# Patient Record
Sex: Female | Born: 1970 | Race: White | Hispanic: No | Marital: Married | Smoking: Never smoker
Health system: Southern US, Community
[De-identification: ages and names within clinical notes are randomized; demographics above are authoritative.]

---

## 1999-02-07 ENCOUNTER — Other Ambulatory Visit: Admission: RE | Admit: 1999-02-07 | Discharge: 1999-02-07 | Payer: Self-pay | Admitting: Obstetrics and Gynecology

## 1999-03-04 ENCOUNTER — Ambulatory Visit (HOSPITAL_COMMUNITY): Admission: RE | Admit: 1999-03-04 | Discharge: 1999-03-04 | Payer: Self-pay | Admitting: Obstetrics and Gynecology

## 1999-03-04 ENCOUNTER — Encounter: Payer: Self-pay | Admitting: Obstetrics and Gynecology

## 2003-02-14 ENCOUNTER — Ambulatory Visit (HOSPITAL_COMMUNITY): Admission: RE | Admit: 2003-02-14 | Discharge: 2003-02-14 | Payer: Self-pay | Admitting: Obstetrics and Gynecology

## 2003-02-14 ENCOUNTER — Encounter: Payer: Self-pay | Admitting: Obstetrics and Gynecology

## 2003-03-27 ENCOUNTER — Inpatient Hospital Stay (HOSPITAL_COMMUNITY): Admission: AD | Admit: 2003-03-27 | Discharge: 2003-03-27 | Payer: Self-pay | Admitting: Obstetrics and Gynecology

## 2003-04-15 ENCOUNTER — Inpatient Hospital Stay (HOSPITAL_COMMUNITY): Admission: AD | Admit: 2003-04-15 | Discharge: 2003-04-15 | Payer: Self-pay | Admitting: Obstetrics and Gynecology

## 2003-04-16 ENCOUNTER — Inpatient Hospital Stay (HOSPITAL_COMMUNITY): Admission: AD | Admit: 2003-04-16 | Discharge: 2003-04-18 | Payer: Self-pay | Admitting: Obstetrics and Gynecology

## 2003-04-19 ENCOUNTER — Encounter: Admission: RE | Admit: 2003-04-19 | Discharge: 2003-05-19 | Payer: Self-pay | Admitting: Obstetrics and Gynecology

## 2003-06-19 ENCOUNTER — Encounter: Admission: RE | Admit: 2003-06-19 | Discharge: 2003-07-19 | Payer: Self-pay | Admitting: Obstetrics and Gynecology

## 2003-07-20 ENCOUNTER — Encounter: Admission: RE | Admit: 2003-07-20 | Discharge: 2003-08-19 | Payer: Self-pay | Admitting: Obstetrics and Gynecology

## 2003-09-19 ENCOUNTER — Encounter: Admission: RE | Admit: 2003-09-19 | Discharge: 2003-10-19 | Payer: Self-pay | Admitting: Obstetrics and Gynecology

## 2003-11-19 ENCOUNTER — Encounter: Admission: RE | Admit: 2003-11-19 | Discharge: 2003-12-19 | Payer: Self-pay | Admitting: Obstetrics and Gynecology

## 2009-04-17 ENCOUNTER — Ambulatory Visit: Payer: Self-pay | Admitting: Diagnostic Radiology

## 2009-04-17 ENCOUNTER — Ambulatory Visit (HOSPITAL_BASED_OUTPATIENT_CLINIC_OR_DEPARTMENT_OTHER): Admission: RE | Admit: 2009-04-17 | Discharge: 2009-04-17 | Payer: Self-pay | Admitting: Cardiology

## 2011-03-20 NOTE — H&P (Signed)
[name], [name]                         ACCOUNT NO.:  [id number]   MEDICAL RECORD NO.:  [id number]                   PATIENT TYPE:  INP   LOCATION:  9174                                 FACILITY:  [facility]   PHYSICIAN:  Concha Pyo. Duplantis, C.N.M.        DATE OF BIRTH:  June 24, 1971   DATE OF ADMISSION:  04/16/2003  DATE OF DISCHARGE:                                HISTORY & PHYSICAL   HISTORY OF PRESENT ILLNESS:  The patient is a 40 year old married white  female, gravida 2, para 1-0-0-1, at 39-3/7 weeks who presents complaining of  uterine contractions that are very painful every five to seven minutes since  about 10:30 this morning. She denies any leaking or vaginal bleeding. She  denies any nausea, vomiting, headaches, or visual disturbances. Her  pregnancy has been followed at [facility] by the certified nurse  midwife service and has been essentially uncomplicated, though, at risk for  history of positive Group B Strep, history of irritable bowel syndrome,  history of depression without problems this pregnancy. Her Group B Strep is  positive with this pregnancy. She is currently undecided regarding pain  management plans.   PAST OBSTETRICAL HISTORY:  She is a gravida 2, para 1-0-0-1, who delivered a  viable female infant in October of 1998 who weighed 8 pounds 4 ounces at 39-  5/7 weeks following a 8-1/2 hour labor.  She delivered vaginally with an  epidural for anesthesia and was induced with that labor secondary to rupture  of membranes.  The infant's name is Vicente Serene.   GYN HISTORY:  Noncontributory.   ALLERGIES:  No known drug allergies.   PAST MEDICAL HISTORY:  She reports having had the usual childhood diseases.  She reports a history of transfusions when she was a tiny infant for a  jaundice and a history of irritable bowel syndrome that has not required  medications.  Occasional urinary tract infections and kidney infection in  1992 which she took p.o.  medications for, and a history of depression.  Her  only surgical history was wisdom teeth in 1988.   FAMILY HISTORY:  Significant for paternal grandfather with MI, father with  chronic hypertension, paternal grandmother with varicosities, maternal  grandfather with insulin-dependent diabetes mellitus, mother with  hypothyroidism, maternal grandmother with breast cancer, and maternal  grandfather with lung cancer, both of them are deceased.  The genetic  history is essentially negative, but the father of the baby's brother and  sister were born with a heart defect that required surgery.   SOCIAL HISTORY:  She is married to [facility]. They are of the Catholic  faith. The deny any illicit drug use, alcohol, or smoking with this  pregnancy. She is employed part time in a [employer]. He is employed full  time in Public relations account executive and they have been followed by the certified nurse  midwife service in this pregnancy.   PRENATAL LABORATORY DATA:  Blood type  is A positive, antibody screen is  negative, syphilis is nonreactive, rubella is positive, hepatitis B surface  antigen is negative. Pap was within normal limits in May of 2003.  One-hour  Glucola was 37 and a 36-week Beta Strep was positive.  Gonorrhea and  Chlamydia were negative.   PHYSICAL EXAMINATION:  VITAL SIGNS: Stable. She is afebrile.  HEENT:  Grossly within normal limits.  HEART:  Regular rate and rhythm.  CHEST:  Clear.  BREASTS:  Soft and nontender.  ABDOMEN:  Gravid with uterine contractions every three to five minutes.  Fetal heart rate is reactive and reassuring.  PELVIC: 5 cm, 100%, vertex -1 with intact membranes.  EXTREMITIES:  Within normal limits.   ASSESSMENT:  1. Intrauterine pregnancy at term.  2. Active labor.  3. Positive Group B Strep.   PLAN:  To admit to labor and delivery.  To follow routine C.N.M. orders and  to notify Dr. Estanislado Pandy of the patient's admission.                                                Concha Pyo. Duplantis, C.N.M.    SJD/MEDQ  D:  04/16/2003  T:  04/16/2003  Job:  [id number]

## 2014-05-10 ENCOUNTER — Encounter (HOSPITAL_BASED_OUTPATIENT_CLINIC_OR_DEPARTMENT_OTHER): Payer: Self-pay | Admitting: Emergency Medicine

## 2014-05-10 ENCOUNTER — Emergency Department (HOSPITAL_BASED_OUTPATIENT_CLINIC_OR_DEPARTMENT_OTHER)
Admission: EM | Admit: 2014-05-10 | Discharge: 2014-05-10 | Disposition: A | Discharge status: Home / Self Care | Payer: Managed Care, Other (non HMO) | Attending: Emergency Medicine | Admitting: Emergency Medicine

## 2014-05-10 ENCOUNTER — Emergency Department (HOSPITAL_BASED_OUTPATIENT_CLINIC_OR_DEPARTMENT_OTHER): Payer: Managed Care, Other (non HMO)

## 2014-05-10 DIAGNOSIS — R072 Precordial pain: Secondary | ICD-10-CM | POA: Insufficient documentation

## 2014-05-10 DIAGNOSIS — R0789 Other chest pain: Secondary | ICD-10-CM

## 2014-05-10 DIAGNOSIS — Z79899 Other long term (current) drug therapy: Secondary | ICD-10-CM | POA: Insufficient documentation

## 2014-05-10 LAB — TROPONIN I: Troponin I: <0.3 ng/mL (ref <0.30)

## 2014-05-10 NOTE — ED Notes (Signed)
Pt developed pain with movement in her right flank, denies injury, states that her muscles hurt

## 2014-05-10 NOTE — ED Notes (Signed)
C/o rt flank pain onset Sunday  Getting worse,  Increased w movement  Denies inj  Denies urinary sx

## 2014-05-10 NOTE — Discharge Instructions (Signed)
You have been diagnosed by your caregiver as having chest wall pain. SEEK IMMEDIATE MEDICAL ATTENTION IF: You develop a fever.  Your chest pains become severe or intolerable.  You develop new, unexplained symptoms (problems).  You develop shortness of breath, nausea, vomiting, sweating or feel light headed.  You develop a new cough or you cough up blood.  Your caregiver has diagnosed you as having chest pain that does not require admission.  You are at low risk for an acute heart condition or other serious illness. Chest pain comes from many different causes.  SEEK IMMEDIATE MEDICAL ATTENTION IF: You have severe chest pain, especially if the pain is crushing or pressure-like and spreads to the arms, back, neck, or jaw, or if you have sweating, nausea (feeling sick to your stomach), or shortness of breath. THIS IS AN EMERGENCY. Don't wait to see if the pain will go away. Get medical help at once. Call 911 or 0 (operator). DO NOT drive yourself to the hospital.  Your chest pain gets worse and does not go away with rest.  You have an attack of chest pain lasting longer than usual, despite rest and treatment with the medications your caregiver has prescribed.  You wake from sleep with chest pain or shortness of breath.  You feel dizzy or faint.  You have chest pain not typical of your usual pain for which you originally saw your caregiver.

## 2014-05-10 NOTE — ED Provider Notes (Signed)
CSN: [id number]     Arrival date & time 05/10/14  1942 History  This chart was scribed for Hurman Horn, MD by Chestine Spore, ED Scribe. The patient was seen in room MH09/MH09 at 9:42 PM.    Chief Complaint  Patient presents with  . Flank Pain     The history is provided by the patient. No language interpreter was used.   HPI Comments: [name] is a 43 y.o. female who presents to the Emergency Department complaining of gradual constant positional nonpleuritic nonexertional right flank pain for over 12 hours. She states that the pain is excerabated with movement. She states that she is normally healthy. She states that these symptoms is new. She states that her pain is the worse with palpation. She denies radiation to the extremities. She denies rash or blisters to the area. She states that she massaged it last night with no relief. She states that she has not seen her Doctor for these symptoms. She states that she has no new activity.   She states that she has not taken any medications for the symptoms.   She denies asthma, DM, kidney stones or kidney infections, gallbladder issues, or trauma. She denies fever, cough, SOB, nausea, and vomiting.  She states that she has not had CA or recent surgery, or a family history of blood clots in the lung.   History reviewed. No pertinent past medical history. History reviewed. No pertinent past surgical history. History reviewed. No pertinent family history. History  Substance Use Topics  . Smoking status: Never Smoker   . Smokeless tobacco: Not on file  . Alcohol Use: No   OB History   Grav Para Term Preterm Abortions TAB SAB Ect Mult Living                 Review of Systems  Constitutional: Negative for fever.  Respiratory: Negative for cough and shortness of breath.   Gastrointestinal: Negative for nausea and vomiting.  Genitourinary: Positive for flank pain.    10 Systems reviewed and are negative for acute change except as  noted in the HPI.   Allergies  Review of patient's allergies indicates no known allergies.  Home Medications   Prior to Admission medications   Medication Sig Start Date End Date Taking? Authorizing Provider  DULoxetine (CYMBALTA) 20 MG capsule Take 20 mg by mouth daily.   Yes Historical Provider, MD   BP 114/76  Pulse 86  Temp(Src) 98.7 F (37.1 C) (Oral)  Resp 16  Ht 5\' 6"  (1.676 m)  Wt 200 lb (90.719 kg)  BMI 32.30 kg/m2  SpO2 100%  LMP 05/03/2014  Physical Exam  Nursing note and vitals reviewed. Constitutional:  Awake, alert, nontoxic appearance.  HENT:  Head: Atraumatic.  Eyes: Right eye exhibits no discharge. Left eye exhibits no discharge.  Neck: Neck supple.  Cardiovascular: Normal rate and regular rhythm.   No murmur heard. Pulmonary/Chest: Effort normal and breath sounds normal. No respiratory distress. She has no wheezes. She has no rales. She exhibits no tenderness.  Right lateral lower chest wall tenderness without rash.   Abdominal: Soft. There is no tenderness. There is no rebound.  Musculoskeletal: She exhibits no tenderness.  Baseline ROM, no obvious new focal weakness.  Neurological:  Mental status and motor strength appears baseline for patient and situation.  Skin: No rash noted.  Psychiatric: She has a normal mood and affect.    ED Course  Procedures (including critical care time) DIAGNOSTIC STUDIES: Oxygen  Saturation is 100% on room air, normal by my interpretation.    COORDINATION OF CARE: 9:50 PM-Discussed treatment plan which includes CXR, labs, and Cardiac monitoring with pt at bedside and pt agreed to plan. PERC negative. Suspect unlikely to be ACS. Patient informed of clinical course, understand medical decision-making process, and agree with plan. Labs Review Labs Reviewed  TROPONIN I    Imaging Review Dg Chest 2 View  05/10/2014   CLINICAL DATA:  Right-sided chest pain.  EXAM: CHEST  2 VIEW  COMPARISON:  None.  FINDINGS: The  heart size and mediastinal contours are within normal limits. Both lungs are clear. The visualized skeletal structures are unremarkable.  IMPRESSION: No active cardiopulmonary disease.   Electronically Signed   By: Myles RosenthalJohn  Stahl M.D.   On: 05/10/2014 22:13     EKG Interpretation None     PERC negative.  MDM   Final diagnoses:  Right-sided chest wall pain    I doubt any other EMC precluding discharge at this time including, but not necessarily limited to the following:PE, AMI.  I personally performed the services described in this documentation, which was scribed in my presence. The recorded information has been reviewed and is accurate.    Hurman HornJohn M Kyarra Vancamp, MD 05/11/14 1124

## 2014-11-08 IMAGING — CR DG CHEST 2V
2 series · 2 of 2 positions shown · non-contrast
Comparison: None.

CLINICAL DATA: Right-sided chest pain.

EXAM:
CHEST  2 VIEW

[w chest pa]
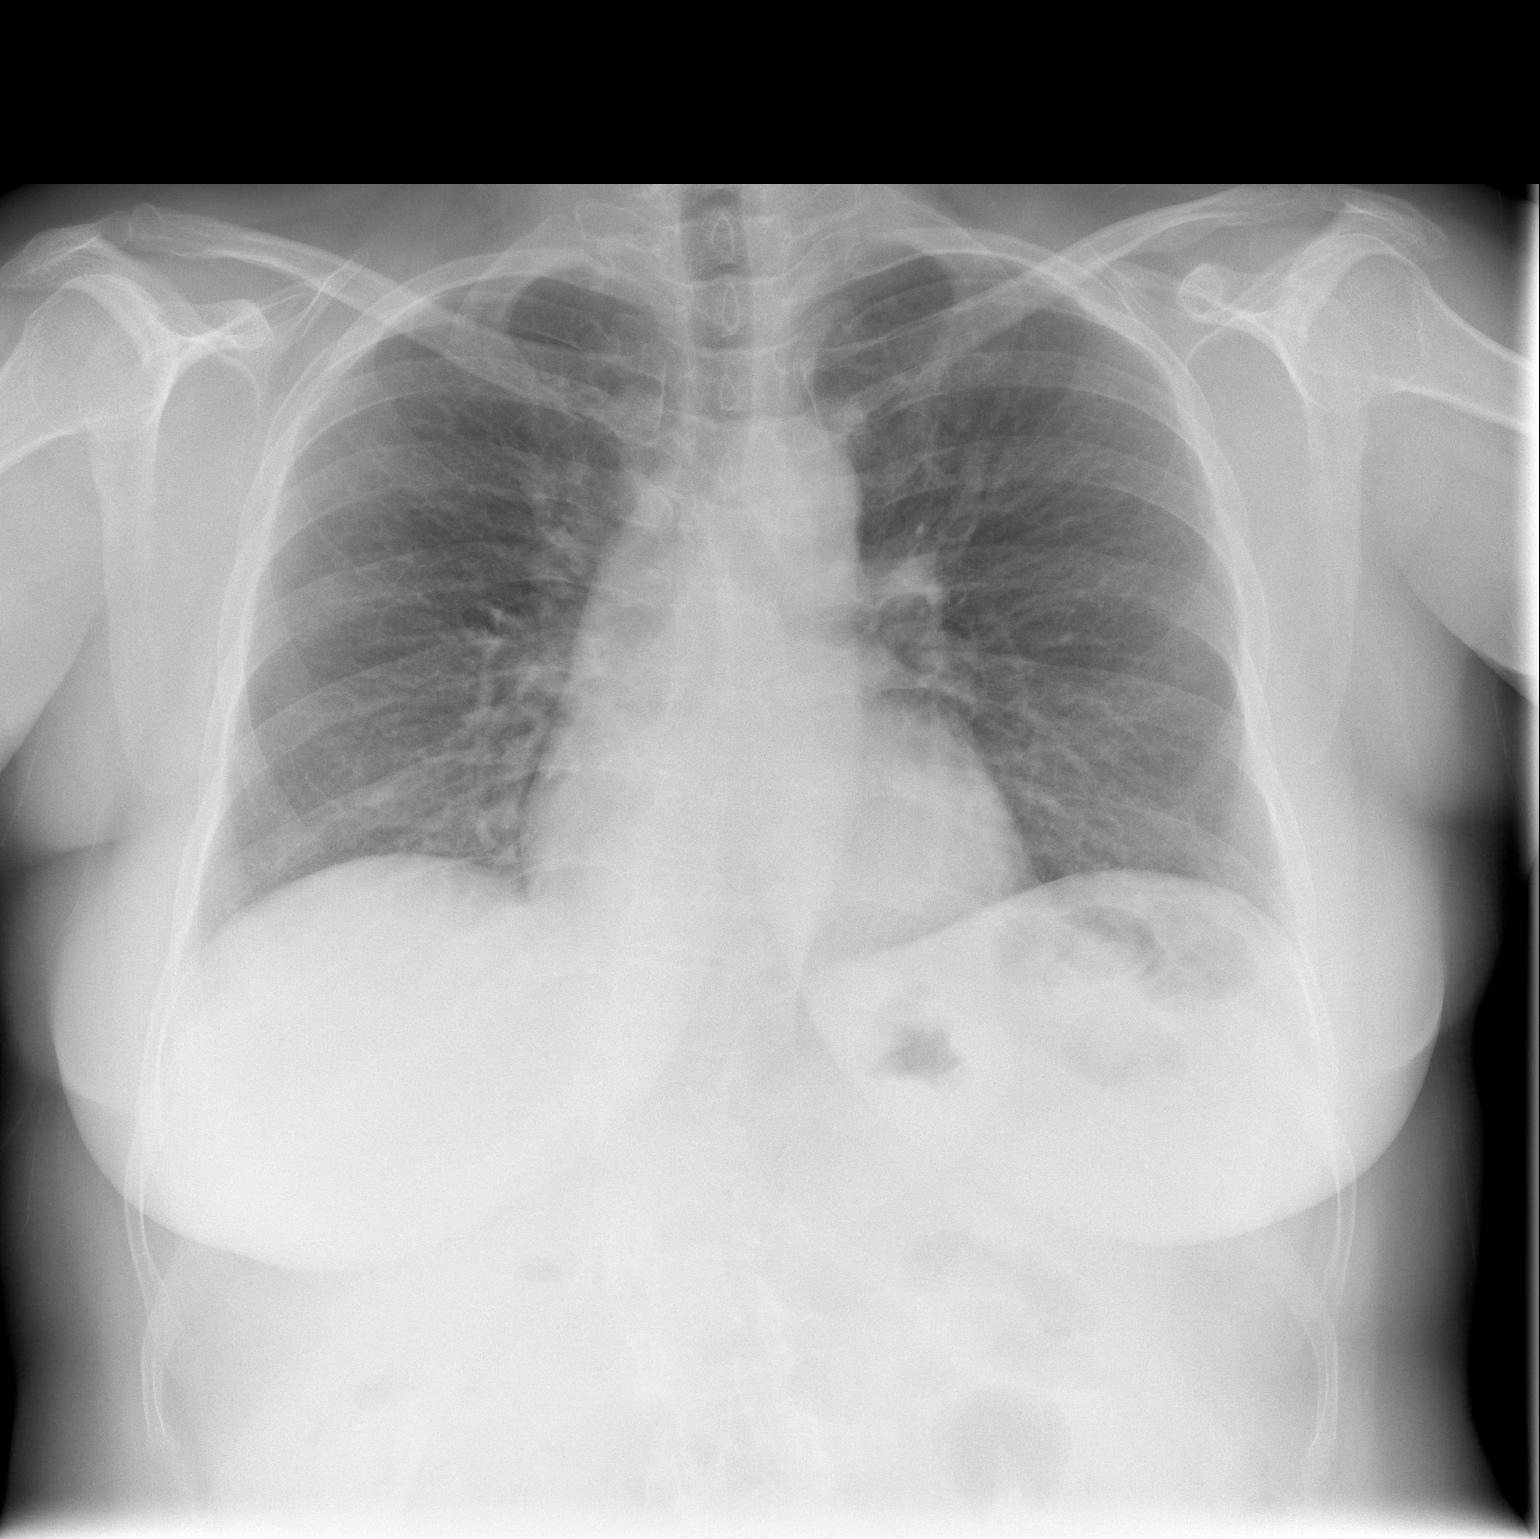

[w chest lat]
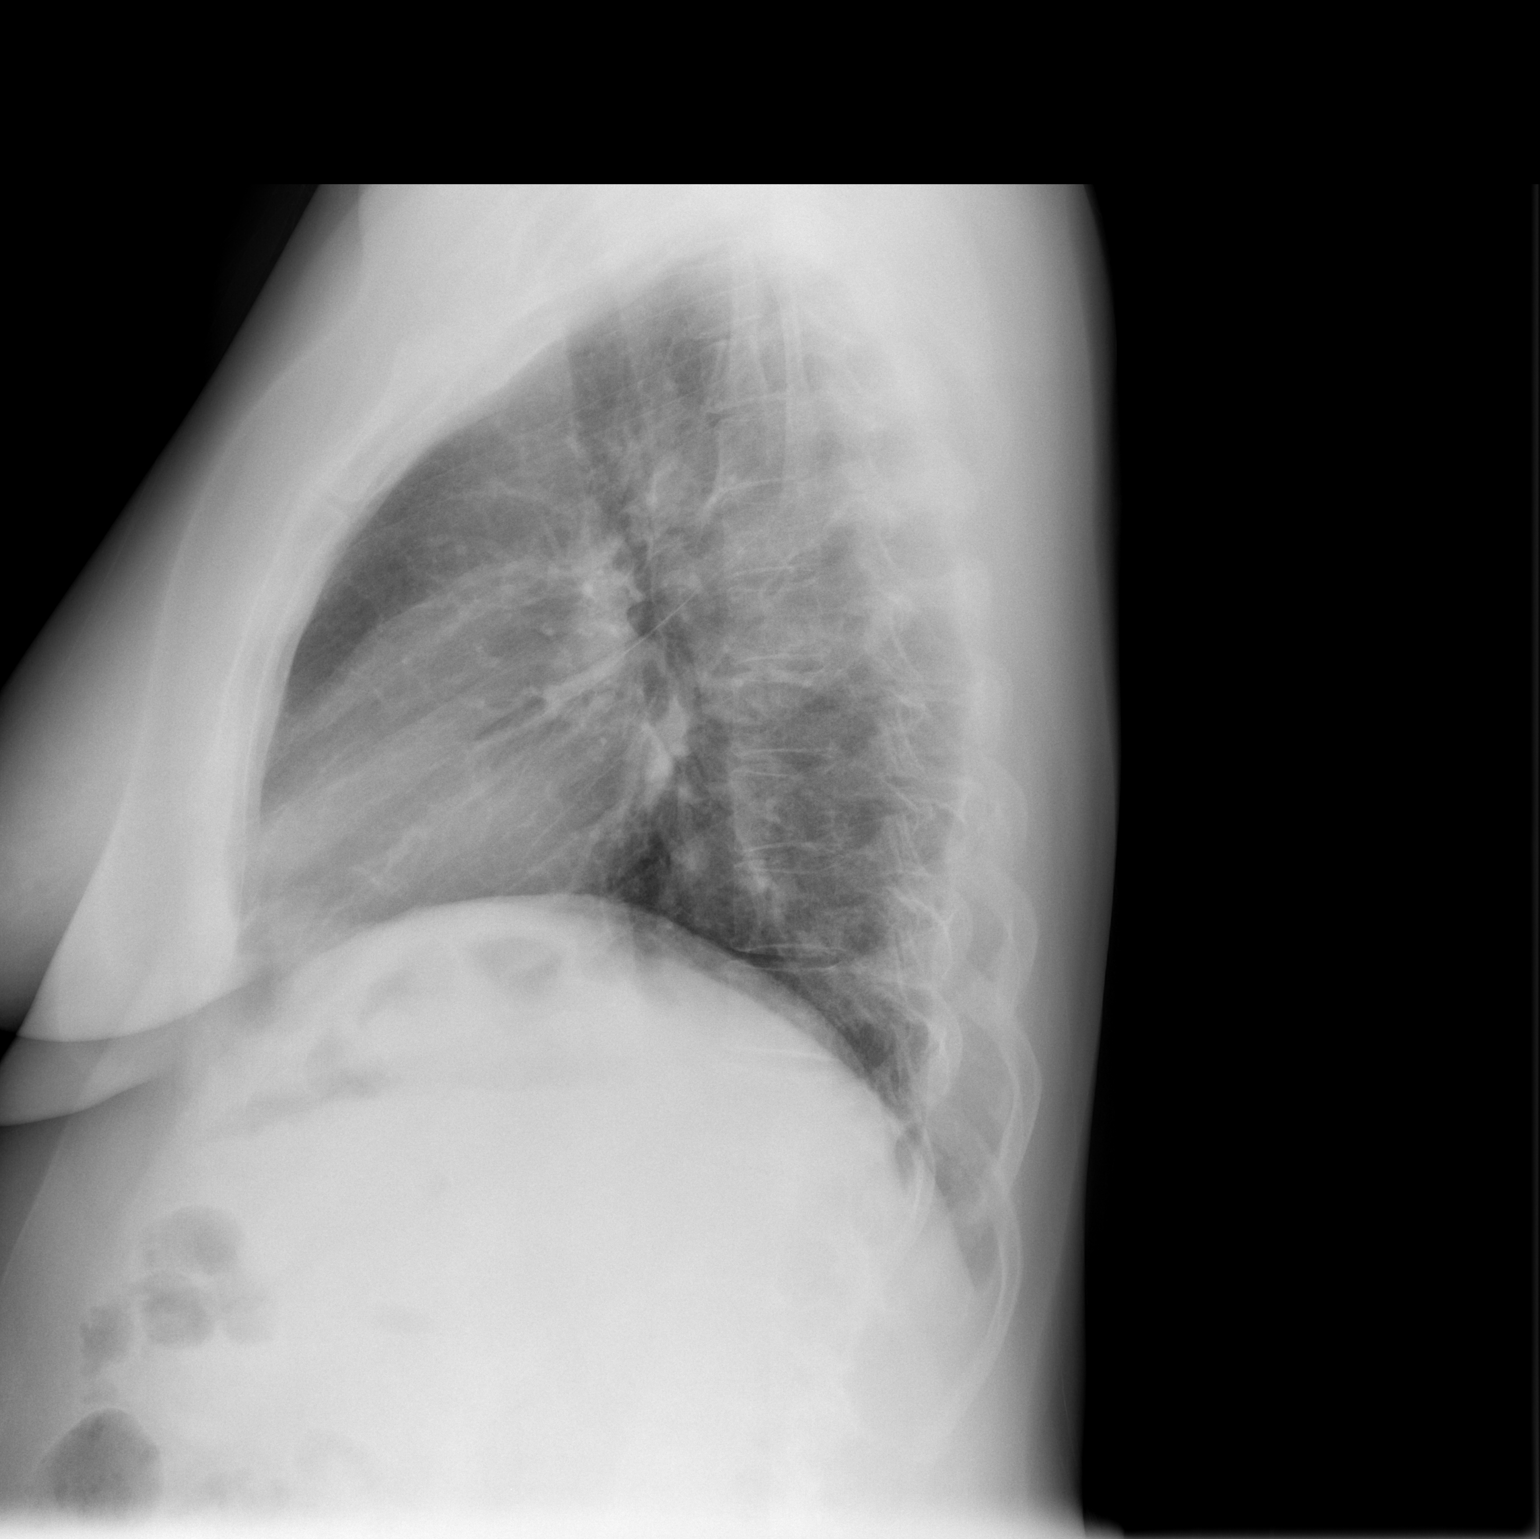

[2 of 2 positions shown; findings below may reference images not displayed]

FINDINGS: The heart size and mediastinal contours are within normal limits.
Both lungs are clear. The visualized skeletal structures are
unremarkable.
IMPRESSION: No active cardiopulmonary disease.
